# Patient Record
Sex: Male | Born: 1965
Health system: Southern US, Community
[De-identification: ages and names within clinical notes are randomized; demographics above are authoritative.]

## PROBLEM LIST (undated history)

## (undated) DIAGNOSIS — E119 Type 2 diabetes mellitus without complications: Secondary | ICD-10-CM

## (undated) DIAGNOSIS — I82409 Acute embolism and thrombosis of unspecified deep veins of unspecified lower extremity: Secondary | ICD-10-CM

---

## 2019-08-29 DIAGNOSIS — E109 Type 1 diabetes mellitus without complications: Secondary | ICD-10-CM | POA: Diagnosis not present

## 2019-08-29 DIAGNOSIS — H524 Presbyopia: Secondary | ICD-10-CM | POA: Diagnosis not present

## 2019-10-09 DIAGNOSIS — E291 Testicular hypofunction: Secondary | ICD-10-CM | POA: Diagnosis not present

## 2019-10-09 DIAGNOSIS — E039 Hypothyroidism, unspecified: Secondary | ICD-10-CM | POA: Diagnosis not present

## 2019-10-09 DIAGNOSIS — E1165 Type 2 diabetes mellitus with hyperglycemia: Secondary | ICD-10-CM | POA: Diagnosis not present

## 2020-01-15 DIAGNOSIS — I82411 Acute embolism and thrombosis of right femoral vein: Secondary | ICD-10-CM | POA: Diagnosis not present

## 2020-01-15 DIAGNOSIS — Z8739 Personal history of other diseases of the musculoskeletal system and connective tissue: Secondary | ICD-10-CM | POA: Diagnosis not present

## 2020-01-15 DIAGNOSIS — R0602 Shortness of breath: Secondary | ICD-10-CM | POA: Diagnosis not present

## 2020-01-15 DIAGNOSIS — E119 Type 2 diabetes mellitus without complications: Secondary | ICD-10-CM | POA: Diagnosis not present

## 2020-01-15 DIAGNOSIS — Z9989 Dependence on other enabling machines and devices: Secondary | ICD-10-CM | POA: Diagnosis not present

## 2020-01-15 DIAGNOSIS — Z86711 Personal history of pulmonary embolism: Secondary | ICD-10-CM | POA: Diagnosis not present

## 2020-01-15 DIAGNOSIS — I824Z1 Acute embolism and thrombosis of unspecified deep veins of right distal lower extremity: Secondary | ICD-10-CM | POA: Diagnosis not present

## 2020-01-15 DIAGNOSIS — I82401 Acute embolism and thrombosis of unspecified deep veins of right lower extremity: Secondary | ICD-10-CM | POA: Diagnosis not present

## 2020-01-15 DIAGNOSIS — I2699 Other pulmonary embolism without acute cor pulmonale: Secondary | ICD-10-CM | POA: Diagnosis not present

## 2020-01-15 DIAGNOSIS — I82451 Acute embolism and thrombosis of right peroneal vein: Secondary | ICD-10-CM | POA: Diagnosis not present

## 2020-01-15 DIAGNOSIS — Z86718 Personal history of other venous thrombosis and embolism: Secondary | ICD-10-CM | POA: Diagnosis not present

## 2020-01-15 DIAGNOSIS — G4733 Obstructive sleep apnea (adult) (pediatric): Secondary | ICD-10-CM | POA: Diagnosis not present

## 2020-01-15 DIAGNOSIS — Z794 Long term (current) use of insulin: Secondary | ICD-10-CM | POA: Diagnosis not present

## 2020-01-15 DIAGNOSIS — Z79899 Other long term (current) drug therapy: Secondary | ICD-10-CM | POA: Diagnosis not present

## 2020-01-16 DIAGNOSIS — I2699 Other pulmonary embolism without acute cor pulmonale: Secondary | ICD-10-CM | POA: Diagnosis not present

## 2020-01-16 DIAGNOSIS — I82411 Acute embolism and thrombosis of right femoral vein: Secondary | ICD-10-CM | POA: Diagnosis not present

## 2020-01-16 DIAGNOSIS — I824Z1 Acute embolism and thrombosis of unspecified deep veins of right distal lower extremity: Secondary | ICD-10-CM | POA: Diagnosis not present

## 2020-01-16 DIAGNOSIS — I82441 Acute embolism and thrombosis of right tibial vein: Secondary | ICD-10-CM | POA: Diagnosis not present

## 2020-01-17 DIAGNOSIS — I82441 Acute embolism and thrombosis of right tibial vein: Secondary | ICD-10-CM | POA: Diagnosis not present

## 2020-01-17 DIAGNOSIS — R079 Chest pain, unspecified: Secondary | ICD-10-CM | POA: Diagnosis not present

## 2020-01-17 DIAGNOSIS — I2699 Other pulmonary embolism without acute cor pulmonale: Secondary | ICD-10-CM | POA: Diagnosis not present

## 2020-01-17 DIAGNOSIS — I82431 Acute embolism and thrombosis of right popliteal vein: Secondary | ICD-10-CM | POA: Diagnosis not present

## 2020-01-17 DIAGNOSIS — I82411 Acute embolism and thrombosis of right femoral vein: Secondary | ICD-10-CM | POA: Diagnosis not present

## 2020-01-17 DIAGNOSIS — R9431 Abnormal electrocardiogram [ECG] [EKG]: Secondary | ICD-10-CM | POA: Diagnosis not present

## 2020-01-18 DIAGNOSIS — G4733 Obstructive sleep apnea (adult) (pediatric): Secondary | ICD-10-CM | POA: Diagnosis not present

## 2020-01-18 DIAGNOSIS — E119 Type 2 diabetes mellitus without complications: Secondary | ICD-10-CM | POA: Diagnosis not present

## 2020-01-18 DIAGNOSIS — I82401 Acute embolism and thrombosis of unspecified deep veins of right lower extremity: Secondary | ICD-10-CM | POA: Diagnosis not present

## 2020-01-18 DIAGNOSIS — I2699 Other pulmonary embolism without acute cor pulmonale: Secondary | ICD-10-CM | POA: Diagnosis not present

## 2020-01-20 DIAGNOSIS — R9431 Abnormal electrocardiogram [ECG] [EKG]: Secondary | ICD-10-CM | POA: Diagnosis not present

## 2020-01-22 DIAGNOSIS — I2699 Other pulmonary embolism without acute cor pulmonale: Secondary | ICD-10-CM | POA: Diagnosis not present

## 2020-01-22 DIAGNOSIS — R0602 Shortness of breath: Secondary | ICD-10-CM | POA: Diagnosis not present

## 2020-01-29 DIAGNOSIS — N289 Disorder of kidney and ureter, unspecified: Secondary | ICD-10-CM | POA: Diagnosis not present

## 2020-01-29 DIAGNOSIS — I2699 Other pulmonary embolism without acute cor pulmonale: Secondary | ICD-10-CM | POA: Diagnosis not present

## 2020-01-29 DIAGNOSIS — E559 Vitamin D deficiency, unspecified: Secondary | ICD-10-CM | POA: Diagnosis not present

## 2020-01-29 DIAGNOSIS — E039 Hypothyroidism, unspecified: Secondary | ICD-10-CM | POA: Diagnosis not present

## 2020-01-29 DIAGNOSIS — I82402 Acute embolism and thrombosis of unspecified deep veins of left lower extremity: Secondary | ICD-10-CM | POA: Diagnosis not present

## 2020-01-29 DIAGNOSIS — R790 Abnormal level of blood mineral: Secondary | ICD-10-CM | POA: Diagnosis not present

## 2020-01-29 DIAGNOSIS — E538 Deficiency of other specified B group vitamins: Secondary | ICD-10-CM | POA: Diagnosis not present

## 2020-01-29 DIAGNOSIS — E1165 Type 2 diabetes mellitus with hyperglycemia: Secondary | ICD-10-CM | POA: Diagnosis not present

## 2020-01-30 DIAGNOSIS — G4733 Obstructive sleep apnea (adult) (pediatric): Secondary | ICD-10-CM | POA: Diagnosis not present

## 2020-01-30 DIAGNOSIS — G4730 Sleep apnea, unspecified: Secondary | ICD-10-CM | POA: Diagnosis not present

## 2020-02-18 ENCOUNTER — Emergency Department (INDEPENDENT_AMBULATORY_CARE_PROVIDER_SITE_OTHER): Payer: BC Managed Care – PPO

## 2020-02-18 ENCOUNTER — Other Ambulatory Visit: Payer: Self-pay

## 2020-02-18 ENCOUNTER — Emergency Department
Admission: EM | Admit: 2020-02-18 | Discharge: 2020-02-18 | Disposition: A | Discharge status: Home / Self Care | Payer: Self-pay | Source: Home / Self Care

## 2020-02-18 DIAGNOSIS — L03114 Cellulitis of left upper limb: Secondary | ICD-10-CM | POA: Diagnosis not present

## 2020-02-18 DIAGNOSIS — M25422 Effusion, left elbow: Secondary | ICD-10-CM | POA: Diagnosis not present

## 2020-02-18 DIAGNOSIS — M7989 Other specified soft tissue disorders: Secondary | ICD-10-CM | POA: Diagnosis not present

## 2020-02-18 DIAGNOSIS — M71122 Other infective bursitis, left elbow: Secondary | ICD-10-CM

## 2020-02-18 DIAGNOSIS — M25522 Pain in left elbow: Secondary | ICD-10-CM

## 2020-02-18 HISTORY — DX: Type 2 diabetes mellitus without complications: E11.9

## 2020-02-18 HISTORY — DX: Acute embolism and thrombosis of unspecified deep veins of unspecified lower extremity: I82.409

## 2020-02-18 LAB — POCT CBC W AUTO DIFF (K'VILLE URGENT CARE)

## 2020-02-18 MED ORDER — CEFTRIAXONE SODIUM 1 G IJ SOLR
1.0000 g | Freq: Once | INTRAMUSCULAR | Status: AC
  Administered 2020-02-18: 1 g via INTRAMUSCULAR

## 2020-02-18 MED ORDER — PREDNISONE 50 MG PO TABS
ORAL_TABLET | ORAL | 0 refills | Status: DC
Start: 2020-02-18 — End: 2020-03-05

## 2020-02-18 MED ORDER — DOXYCYCLINE HYCLATE 100 MG PO TABS
100.0000 mg | ORAL_TABLET | Freq: Two times a day (BID) | ORAL | 0 refills | Status: AC
Start: 2020-02-18 — End: 2020-02-25

## 2020-02-18 MED ORDER — HYDROCODONE-ACETAMINOPHEN 10-325 MG PO TABS: 1.0000 | ORAL_TABLET | Freq: Three times a day (TID) | ORAL | 0 refills | Status: DC | PRN

## 2020-02-18 NOTE — Assessment & Plan Note (Addendum)
This is a pleasant 54 year old male, he has history of diabetes, over the past day he has developed severe and rapid onset swelling in his left elbow, over the olecranon bursitis, he had a fever of over 100, pain is severe. CBC showed a white count of 13,000. On exam he has a visibly swollen, erythematous, and hot elbow, particularly over the olecranon bursa, nonfluctuant, no skin breakdown. I think he has a septic olecranon bursitis, we will have the urgent care provider give him 1 g of Rocephin intramuscular, I am also going to have them add on a uric acid level to the blood already obtained. Adding x-rays. I am going to add high-dose hydrocodone, prednisone, doxycycline, he can return to see me sometime Wednesday or Thursday in my office.

## 2020-02-18 NOTE — ED Triage Notes (Signed)
Patient presents to Urgent Care with complaints of left elbow pain since last night. Patient reports his wife is a Engineer, civil (consulting) and she thinks it is gout. Pt had a blood clot 6 weeks ago and was hospitalized; is on Xarelto right now and has been taking it as directed. Elbow is red and swollen, is painful to even light palpation, no hx of gout.

## 2020-02-18 NOTE — Discharge Instructions (Signed)
  Please take the medications as prescribed by Dr. Benjamin Stain (Dr. Karie Schwalbe)  Call tomorrow to schedule a follow up appointment on Wednesday or Thursday this week.  If symptoms significantly worsen before then, please go to the hospital for further evaluation and treatment.

## 2020-02-18 NOTE — ED Provider Notes (Signed)
[facility]    CSN: [id number] Arrival date & time: 02/18/20  1345      History   Chief Complaint Chief Complaint  Patient presents with  . Gout  . Elbow Pain    HPI Thomas Carranza. is a 54 y.o. male.   HPI  Thomas Couser. is a 54 y.o. male presenting to UC with c/o sudden onset Left elbow pain, swelling, redness, limited ROM since last night. He was working in his basement last night but denies injury or heavy lifting. His wife, who is a Engineer, civil (consulting), believes he has gout. No hx of gout.  Pt did have a temp of 100.5*F this morning.  Mild nausea.  Hx of blood clot in his Right leg 6 weeks ago, was hospitalized. He is currently on Xarelto and has been taking as prescribed.     Past Medical History:  Diagnosis Date  . Diabetes mellitus without complication (HCC)   . DVT (deep venous thrombosis) [facility])     Patient Active Problem List   Diagnosis Date Noted  . Elbow swelling, left 02/18/2020    History reviewed. No pertinent surgical history.     Home Medications    Prior to Admission medications   Medication Sig Start Date End Date Taking? Authorizing Provider  insulin glargine (LANTUS) 100 UNIT/ML injection Inject into the skin daily.   Yes [provider]  metFORMIN (GLUCOPHAGE) 500 MG tablet Take by mouth 2 (two) times daily with a meal.   Yes [provider]  rivaroxaban (XARELTO) 20 MG TABS tablet Take 20 mg by mouth daily with supper.   Yes [provider]  doxycycline (VIBRA-TABS) 100 MG tablet Take 1 tablet (100 mg total) by mouth 2 (two) times daily for 7 days. 02/18/20 02/25/20  Monica Becton, MD  HYDROcodone-acetaminophen (NORCO) 10-325 MG tablet Take 1 tablet by mouth every 8 (eight) hours as needed. 02/18/20   Monica Becton, MD  predniSONE (DELTASONE) 50 MG tablet One tab PO daily for 5 days. 02/18/20   Monica Becton, MD    Family History Family History  Problem Relation Age of Onset  .  Healthy Mother   . Cancer Father     Social History Social History   Tobacco Use  . Smoking status: Never Smoker  . Smokeless tobacco: Never Used  Vaping Use  . Vaping Use: Never used  Substance Use Topics  . Alcohol use: Not Currently  . Drug use: Not on file     Allergies   Patient has no known allergies.   Review of Systems Review of Systems  Constitutional: Positive for fever. Negative for chills.  Gastrointestinal: Positive for nausea. Negative for vomiting.  Musculoskeletal: Positive for arthralgias, joint swelling and myalgias.  Skin: Positive for color change. Negative for wound.     Physical Exam Triage Vital Signs ED Triage Vitals  Enc Vitals Group     BP 02/18/20 1425 118/76     Pulse Rate 02/18/20 1425 88     Resp 02/18/20 1425 16     Temp 02/18/20 1425 100 F (37.8 C)     Temp Source 02/18/20 1425 Oral     SpO2 02/18/20 1425 97 %     Weight --      Height --      Head Circumference --      Peak Flow --      Pain Score 02/18/20 1421 8     Pain Loc --  Pain Edu? --      Excl. in GC? --    No data found.  Updated Vital Signs BP 118/76 (BP Location: Right Arm)   Pulse 88   Temp 100 F (37.8 C) (Oral)   Resp 16   SpO2 97%   Visual Acuity Right Eye Distance:   Left Eye Distance:   Bilateral Distance:    Right Eye Near:   Left Eye Near:    Bilateral Near:     Physical Exam Vitals and nursing note reviewed.  Constitutional:      General: He is not in acute distress.    Appearance: Normal appearance. He is well-developed. He is not ill-appearing.  HENT:     Head: Normocephalic and atraumatic.  Cardiovascular:     Rate and Rhythm: Normal rate and regular rhythm.     Pulses:          Radial pulses are 2+ on the left side.  Pulmonary:     Effort: Pulmonary effort is normal.  Musculoskeletal:        General: Swelling and tenderness present.     Cervical back: Normal range of motion.     Comments: Left elbow: mild to moderate  edema, medial aspect. Tenderness with light touch. Decreased flexion due to pain. No tenderness over olecranon.   No tenderness to shoulder or wrist. 5/5 grip strength.   Skin:    General: Skin is warm and dry.     Capillary Refill: Capillary refill takes less than 2 seconds.     Findings: Erythema present.  Neurological:     Mental Status: He is alert and oriented to person, place, and time.     Sensory: No sensory deficit.  Psychiatric:        Behavior: Behavior normal.      UC Treatments / Results  Labs (all labs ordered are listed, but only abnormal results are displayed) Labs Reviewed  URIC ACID  POCT CBC W AUTO DIFF ([facility])    EKG   Radiology No results found.  Procedures Procedures (including critical care time)  Medications Ordered in UC Medications  cefTRIAXone (ROCEPHIN) injection 1 g (1 g Intramuscular Given 02/18/20 1612)    Initial Impression / Assessment and Plan / UC Course  I have reviewed the triage vital signs and the nursing notes.  Pertinent labs & imaging results that were available during my care of the patient were reviewed by me and considered in my medical decision making (see chart for details).     CBC: WBC- 13.3 Consulted with Dr. Benjamin Stain, Sports Medicine, who also examined pt. Will tx for cellulitis  Uric acid test- pending, gout less likely given fever  Pt to f/u with Dr. Benjamin Stain in 2-3 days AVS provided  Final Clinical Impressions(s) / UC Diagnoses   Final diagnoses:  Pain and swelling of left elbow  Cellulitis of left elbow     Discharge Instructions      Please take the medications as prescribed by Dr. Benjamin Stain (Dr. Karie Schwalbe)  Call tomorrow to schedule a follow up appointment on Wednesday or Thursday this week.  If symptoms significantly worsen before then, please go to the hospital for further evaluation and treatment.     ED Prescriptions    Medication Sig Dispense Auth. Provider    predniSONE (DELTASONE) 50 MG tablet One tab PO daily for 5 days. 5 tablet Monica Becton, MD   HYDROcodone-acetaminophen [facility]) 10-325 MG tablet Take 1 tablet by mouth every  8 (eight) hours as needed. 15 tablet Monica Becton, MD   doxycycline (VIBRA-TABS) 100 MG tablet Take 1 tablet (100 mg total) by mouth 2 (two) times daily for 7 days. 14 tablet Monica Becton, MD     PDMP not reviewed this encounter.   Lurene Shadow, [place] 02/18/20 2033

## 2020-02-18 NOTE — Consult Note (Signed)
    Procedures performed today:    None.  Independent interpretation of notes and tests performed by another provider:   None.  Brief History, Exam, Impression, and Recommendations:    Elbow swelling, left This is a pleasant 54 year old male, he has history of diabetes, over the past day he has developed severe and rapid onset swelling in his left elbow, over the olecranon bursitis, he had a fever of over 100, pain is severe. CBC showed a white count of 13,000. On exam he has a visibly swollen, erythematous, and hot elbow, particularly over the olecranon bursa, nonfluctuant, no skin breakdown. I think he has a septic olecranon bursitis, we will have the urgent care provider give him 1 g of Rocephin intramuscular, I am also going to have them add on a uric acid level to the blood already obtained. Adding x-rays. I am going to add high-dose hydrocodone, prednisone, doxycycline, he can return to see me sometime Wednesday or Thursday in my office.    ___________________________________________ Ihor Austin. Benjamin Stain, M.D., ABFM., CAQSM. Primary Care and Sports Medicine [facility]  Adjunct Instructor of Family Medicine  [employer]

## 2020-02-19 LAB — URIC ACID: Uric Acid, Serum: 2.2 mg/dL — ABNORMAL LOW (ref 4.0–8.0)

## 2020-02-20 ENCOUNTER — Ambulatory Visit (INDEPENDENT_AMBULATORY_CARE_PROVIDER_SITE_OTHER): Payer: BC Managed Care – PPO | Admitting: Sports Medicine

## 2020-02-20 ENCOUNTER — Encounter: Payer: Self-pay | Admitting: Sports Medicine

## 2020-02-20 DIAGNOSIS — L0390 Cellulitis, unspecified: Secondary | ICD-10-CM | POA: Diagnosis not present

## 2020-02-20 DIAGNOSIS — M71122 Other infective bursitis, left elbow: Secondary | ICD-10-CM | POA: Diagnosis not present

## 2020-02-20 NOTE — Progress Notes (Signed)
° ° °  Procedures performed today:    Procedure:  Injection of septic left olecranon bursa Consent obtained and verified. Time-out conducted. Noted no overlying erythema, induration, or other signs of local infection. Skin prepped in a sterile fashion. Topical analgesic spray: Ethyl chloride. I advanced an 18-gauge needle into the olecranon bursa and aspirated approximately 3 mL of frank pus. Advised to call if fevers/chills, erythema, induration, drainage, or persistent bleeding.  Independent interpretation of notes and tests performed by another provider:   X-rays were personally reviewed and negative with the exception of soft tissue subcutaneous stranding  Brief History, Exam, Impression, and Recommendations:    Septic olecranon bursitis, left This is a very pleasant 54 year old male, I saw him on Monday, he had severe elbow swelling, pain, redness, leukocytosis, fevers. We put him on steroids and doxycycline, he has improved approximately 50%. He still has significant fluctuance of his olecranon bursa, today I performed an olecranon bursa aspiration with approximately 3 cc of frank pus, this will be sent off for cultures, cell counts, crystal analysis. He will continue doxycycline for the full 7 days, he finishes prednisone on Friday. Return to see me tentatively in 2 weeks. If he has a recurrence or insufficient improvement we will consider double incision, drainage, and irrigation.    ___________________________________________ Ihor Austin. Benjamin Stain, M.D., ABFM., CAQSM. Primary Care and Sports Medicine [facility] [facility]  Adjunct Instructor of Family Medicine  [employer]

## 2020-02-20 NOTE — Assessment & Plan Note (Addendum)
This is a very pleasant 54 year old male, I saw him on Monday, he had severe elbow swelling, pain, redness, leukocytosis, fevers. We put him on steroids and doxycycline, he has improved approximately 50%. He still has significant fluctuance of his olecranon bursa, today I performed an olecranon bursa aspiration with approximately 3 cc of frank pus, this will be sent off for cultures, cell counts, crystal analysis. He will continue doxycycline for the full 7 days, he finishes prednisone on Friday. Return to see me tentatively in 2 weeks. If he has a recurrence or insufficient improvement we will consider double incision, drainage, and irrigation.

## 2020-02-21 LAB — CELL COUNT AND DIFF, FLUID, OTHER
Basophils, %: 0 %
Eosinophils, %: 0 %
Lymphocytes, %: 8 %
Mesothelial, %: 0 %
Monocyte/Macrophage %: 6 %
Neutrophils, %: 86 %
Total Nucleated Cell Ct: 121600 cells/uL

## 2020-02-21 LAB — SYNOVIAL FLUID, CRYSTAL

## 2020-02-25 ENCOUNTER — Telehealth: Payer: Self-pay | Admitting: *Deleted

## 2020-02-25 NOTE — Telephone Encounter (Signed)
Pt left vm this morning letting you know that yesterday his elvow started swelling back up and is really painful again.

## 2020-02-25 NOTE — Telephone Encounter (Signed)
Pt notified and transferred to scheduling.

## 2020-02-25 NOTE — Telephone Encounter (Signed)
Lets have him come back, maybe wed and I will do an incision and irrigation.

## 2020-02-26 LAB — ANAEROBIC AND AEROBIC CULTURE
MICRO NUMBER:: 10760811
MICRO NUMBER:: 10760812
SPECIMEN QUALITY:: ADEQUATE
SPECIMEN QUALITY:: ADEQUATE

## 2020-02-27 ENCOUNTER — Other Ambulatory Visit: Payer: Self-pay

## 2020-02-27 ENCOUNTER — Ambulatory Visit: Payer: BC Managed Care – PPO | Admitting: Sports Medicine

## 2020-02-27 DIAGNOSIS — M71122 Other infective bursitis, left elbow: Secondary | ICD-10-CM | POA: Diagnosis not present

## 2020-02-27 MED ORDER — ACETAMINOPHEN ER 650 MG PO TBCR: 650.0000 mg | EXTENDED_RELEASE_TABLET | Freq: Three times a day (TID) | ORAL | 3 refills | Status: AC | PRN

## 2020-02-27 MED ORDER — CLINDAMYCIN HCL 300 MG PO CAPS: 300.0000 mg | ORAL_CAPSULE | Freq: Three times a day (TID) | ORAL | 0 refills | Status: DC

## 2020-02-27 NOTE — Progress Notes (Signed)
    Procedures performed today:    Complicated Incision and drainage of abscess. Risks, benefits, and alternatives explained and consent obtained. Time out conducted. Surface cleaned with alcohol. 5cc lidocaine with epinephine infiltrated around abscess/olecranon bursa. Adequate anesthesia ensured. Area prepped and draped in a sterile fashion. #11 blade used to make a stab incision into abscess. Pus expressed with pressure. Curved hemostat used to explore 4 quadrants and loculations broken up. Further purulence expressed. Iodoform packing placed leaving a 1-inch tail. Hemostasis achieved. Pt stable. Aftercare and follow-up advised.  Independent interpretation of notes and tests performed by another provider:   None.  Brief History, Exam, Impression, and Recommendations:    Septic olecranon bursitis, left Thomas Crosby, he is a pleasant 54 year old male, we initially saw him about a week ago with severe elbows pain and swelling, we put him on steroids and doxycycline and he had a 50% provement, I aspirated frank pus, sent off for cultures which grew out fairly resistant MRSA, sensitive to doxycycline and clindamycin. He has improved to some degree with regards to the erythema and warmth but still has significant swelling and pressure, today I performed incision and drainage of his olecranon bursa with packing. I am going to go ahead and switch him to clindamycin which has a better MIC. Oxycodone for pain at night, Tylenol for pain during the day, family will remove 1 inch of packing per day. Return to see me in a week.    ___________________________________________ Thomas Crosby. Benjamin Stain, M.D., ABFM., CAQSM. Primary Care and Sports Medicine [facility] [facility]  Adjunct Instructor of Family Medicine  [employer]

## 2020-02-27 NOTE — Patient Instructions (Signed)
Incision and Drainage, Care After This sheet gives you information about how to care for yourself after your procedure. Your health care provider may also give you more specific instructions. If you have problems or questions, contact your health care provider. What can I expect after the procedure? After the procedure, it is common to have:  Pain or discomfort around the incision site.  Blood, fluid, or pus (drainage) from the incision.  Redness and firm skin around the incision site. Follow these instructions at home: Medicines  Take over-the-counter and prescription medicines only as told by your health care provider.  If you were prescribed an antibiotic medicine, use or take it as told by your health care provider. Do not stop using the antibiotic even if you start to feel better. Wound care Follow instructions from your health care provider about how to take care of your wound. Make sure you:  Wash your hands with soap and water before and after you change your bandage (dressing). If soap and water are not available, use hand sanitizer.  Change your dressing and packing as told by your health care provider. ? If your dressing is dry or stuck when you try to remove it, moisten or wet the dressing with saline or water so that it can be removed without harming your skin or tissues. ? If your wound is packed, leave it in place until your health care provider tells you to remove it. To remove the packing, moisten or wet the packing with saline or water so that it can be removed without harming your skin or tissues.  Leave stitches (sutures), skin glue, or adhesive strips in place. These skin closures may need to stay in place for 2 weeks or longer. If adhesive strip edges start to loosen and curl up, you may trim the loose edges. Do not remove adhesive strips completely unless your health care provider tells you to do that. Check your wound every day for signs of infection. Check  for:  More redness, swelling, or pain.  More fluid or blood.  Warmth.  Pus or a bad smell. If you were sent home with a drain tube in place, follow instructions from your health care provider about:  How to empty it.  How to care for it at home.  General instructions  Rest the affected area.  Do not take baths, swim, or use a hot tub until your health care provider approves. Ask your health care provider if you may take showers. You may only be allowed to take sponge baths.  Return to your normal activities as told by your health care provider. Ask your health care provider what activities are safe for you. Your health care provider may put you on activity or lifting restrictions.  The incision will continue to drain. It is normal to have some clear or slightly bloody drainage. The amount of drainage should lessen each day.  Do not apply any creams, ointments, or liquids unless you have been told to by your health care provider.  Keep all follow-up visits as told by your health care provider. This is important. Contact a health care provider if:  Your cyst or abscess returns.  You have a fever or chills.  You have more redness, swelling, or pain around your incision.  You have more fluid or blood coming from your incision.  Your incision feels warm to the touch.  You have pus or a bad smell coming from your incision.  You have red streaks   above or below the incision site. Get help right away if:  You have severe pain or bleeding.  You cannot eat or drink without vomiting.  You have decreased urine output.  You become short of breath.  You have chest pain.  You cough up blood.  The affected area becomes numb or starts to tingle. These symptoms may represent a serious problem that is an emergency. Do not wait to see if the symptoms will go away. Get medical help right away. Call your local emergency services (911 in the [place] Do not drive yourself to the  hospital. Summary  After this procedure, it is common to have fluid, blood, or pus coming from the surgery site.  Follow all home care instructions. You will be told how to take care of your incision, how to check for infection, and how to take medicines.  If you were prescribed an antibiotic medicine, take it as told by your health care provider. Do not stop taking the antibiotic even if you start to feel better.  Contact a health care provider if you have increased redness, swelling, or pain around your incision. Get help right away if you have chest pain, you vomit, you cough up blood, or you have shortness of breath.  Keep all follow-up visits as told by your health care provider. This is important. This information is not intended to replace advice given to you by your health care provider. Make sure you discuss any questions you have with your health care provider. Document Revised: 06/12/2018 Document Reviewed: 06/12/2018 Elsevier Patient Education  2020 Elsevier Inc.   

## 2020-02-27 NOTE — Assessment & Plan Note (Signed)
Thomas Crosby returns, he is a pleasant 54 year old male, we initially saw him about a week ago with severe elbows pain and swelling, we put him on steroids and doxycycline and he had a 50% provement, I aspirated frank pus, sent off for cultures which grew out fairly resistant MRSA, sensitive to doxycycline and clindamycin. He has improved to some degree with regards to the erythema and warmth but still has significant swelling and pressure, today I performed incision and drainage of his olecranon bursa with packing. I am going to go ahead and switch him to clindamycin which has a better MIC. Oxycodone for pain at night, Tylenol for pain during the day, family will remove 1 inch of packing per day. Return to see me in a week.

## 2020-03-05 ENCOUNTER — Ambulatory Visit (INDEPENDENT_AMBULATORY_CARE_PROVIDER_SITE_OTHER): Payer: BC Managed Care – PPO | Admitting: Sports Medicine

## 2020-03-05 DIAGNOSIS — M71122 Other infective bursitis, left elbow: Secondary | ICD-10-CM

## 2020-03-05 NOTE — Progress Notes (Signed)
    Procedures performed today:    None.  Independent interpretation of notes and tests performed by another provider:   None.  Brief History, Exam, Impression, and Recommendations:    Septic olecranon bursitis, left Thomas Crosby returns, he is a pleasant 54 year old male, he had severe elbow pain, ultimately confirmed to be MRSA septic olecranon bursitis, he has been treated with doxycycline, clindamycin, incision and drainage with packing, he returns today doing significantly better, only minimal serous drainage, he has 1 more day of clindamycin. Continue to cover this as long as it is draining, return to see me on an as-needed basis.    ___________________________________________ Ihor Austin. Benjamin Stain, M.D., ABFM., CAQSM. Primary Care and Sports Medicine [place] [facility]  Adjunct Instructor of Family Medicine  [employer]

## 2020-03-05 NOTE — Assessment & Plan Note (Signed)
Thomas Crosby returns, he is a pleasant 54 year old male, he had severe elbow pain, ultimately confirmed to be MRSA septic olecranon bursitis, he has been treated with doxycycline, clindamycin, incision and drainage with packing, he returns today doing significantly better, only minimal serous drainage, he has 1 more day of clindamycin. Continue to cover this as long as it is draining, return to see me on an as-needed basis.

## 2020-05-01 DIAGNOSIS — G4733 Obstructive sleep apnea (adult) (pediatric): Secondary | ICD-10-CM | POA: Diagnosis not present

## 2020-05-16 ENCOUNTER — Emergency Department
Admission: EM | Admit: 2020-05-16 | Discharge: 2020-05-16 | Disposition: A | Discharge status: Home / Self Care | Payer: BC Managed Care – PPO | Source: Home / Self Care

## 2020-05-16 ENCOUNTER — Other Ambulatory Visit: Payer: Self-pay

## 2020-05-16 DIAGNOSIS — L03115 Cellulitis of right lower limb: Secondary | ICD-10-CM | POA: Diagnosis not present

## 2020-05-16 MED ORDER — CLINDAMYCIN HCL 300 MG PO CAPS: 300.0000 mg | ORAL_CAPSULE | Freq: Three times a day (TID) | ORAL | 0 refills | Status: AC

## 2020-05-16 NOTE — Discharge Instructions (Signed)
°  Keep area clean with warm water and soap. Keep leg elevated. Take medication with food.  Call to schedule a follow up with Dr. Benjamin Stain on Monday or Tuesday, but if symptoms worsening this weekend you can return to urgent care for a recheck or go to the hospital if symptoms significantly worsening this weekend.

## 2020-05-16 NOTE — ED Triage Notes (Signed)
Patient presents to Urgent Care with complaints of abrasion to right knee since 6 days ago. Patient reports he rubbed it against the side of a boat, a few days later two smaller wounds opened up and now redness is creeping down his lower leg. Pt states he had a similar infection on his elbow recently and it was MRSA.

## 2020-05-16 NOTE — ED Provider Notes (Signed)
[facility]    CSN: [id number] Arrival date & time: 05/16/20  1656      History   Chief Complaint Chief Complaint  Patient presents with  . Abscess    R Knee    HPI Thomas Crosby. is a 54 y.o. male.   HPI  Thomas Crosby. is a 54 y.o. male presenting to UC with c/o gradually worsening redness, swelling and pain of Right knee after accidentally scrapping it on a boat about 6 days ago.  He has since noticed a small abrasion and two smaller wounds appear.  Denies fever, chills, n/v/d. He was tx in August of this year for septic olecranon bursitis of Right elbow secondary to MRSA.    Past Medical History:  Diagnosis Date  . Diabetes mellitus without complication (HCC)   . DVT (deep venous thrombosis) [facility])     Patient Active Problem List   Diagnosis Date Noted  . Septic olecranon bursitis, left 02/18/2020    History reviewed. No pertinent surgical history.     Home Medications    Prior to Admission medications   Medication Sig Start Date End Date Taking? Authorizing Provider  acetaminophen (TYLENOL) 650 MG CR tablet Take 1 tablet (650 mg total) by mouth every 8 (eight) hours as needed for pain. 02/27/20   Monica Becton, MD  clindamycin (CLEOCIN) 300 MG capsule Take 1 capsule (300 mg total) by mouth 3 (three) times daily for 7 days. 05/16/20 05/23/20  Lurene Shadow, PA-C  insulin glargine (LANTUS) 100 UNIT/ML injection Inject into the skin daily.    [provider]  metFORMIN (GLUCOPHAGE) 500 MG tablet Take by mouth 2 (two) times daily with a meal.    [provider]  rivaroxaban (XARELTO) 20 MG TABS tablet Take 20 mg by mouth daily with supper.    [provider]    Family History Family History  Problem Relation Age of Onset  . Healthy Mother   . Cancer Father     Social History Social History   Tobacco Use  . Smoking status: Never Smoker  . Smokeless tobacco: Never Used  Vaping Use  . Vaping Use:  Never used  Substance Use Topics  . Alcohol use: Not Currently  . Drug use: Not on file     Allergies   Patient has no known allergies.   Review of Systems Review of Systems  Constitutional: Negative for chills and fever.  Gastrointestinal: Negative for nausea and vomiting.  Musculoskeletal: Positive for arthralgias and joint swelling.  Skin: Positive for color change and wound.     Physical Exam Triage Vital Signs ED Triage Vitals  Enc Vitals Group     BP 05/16/20 1712 132/84     Pulse Rate 05/16/20 1712 88     Resp 05/16/20 1712 16     Temp 05/16/20 1712 99.2 F (37.3 C)     Temp Source 05/16/20 1712 Oral     SpO2 05/16/20 1712 99 %     Weight --      Height --      Head Circumference --      Peak Flow --      Pain Score 05/16/20 1711 7     Pain Loc --      Pain Edu? --      Excl. in GC? --    No data found.  Updated Vital Signs BP 132/84 (BP Location: Right Arm)   Pulse 88  Temp 99.2 F (37.3 C) (Oral)   Resp 16   SpO2 99%   Visual Acuity Right Eye Distance:   Left Eye Distance:   Bilateral Distance:    Right Eye Near:   Left Eye Near:    Bilateral Near:     Physical Exam Vitals and nursing note reviewed.  Constitutional:      Appearance: He is well-developed.  HENT:     Head: Normocephalic and atraumatic.  Cardiovascular:     Rate and Rhythm: Normal rate.  Pulmonary:     Effort: Pulmonary effort is normal.  Musculoskeletal:        General: Swelling and tenderness present. Normal range of motion.     Cervical back: Normal range of motion.     Comments: Right knee: mild to moderate edema, tenderness to anterior aspect, full ROM Calf is soft, non-tender  Skin:    General: Skin is warm and dry.     Findings: Erythema present.     Comments: Right knee: superficial abrasion over anterior aspect patella, 29mm scabs on superior aspect of knee. Surrounding erythema, warmth and tenderness.   Neurological:     Mental Status: He is alert and  oriented to person, place, and time.  Psychiatric:        Behavior: Behavior normal.      UC Treatments / Results  Labs (all labs ordered are listed, but only abnormal results are displayed) Labs Reviewed - No data to display  EKG   Radiology No results found.  Procedures Procedures (including critical care time)  Medications Ordered in UC Medications - No data to display  Initial Impression / Assessment and Plan / UC Course  I have reviewed the triage vital signs and the nursing notes.  Pertinent labs & imaging results that were available during my care of the patient were reviewed by me and considered in my medical decision making (see chart for details).     Hx and exam c/w cellulitis of Right knee Rx: clindamycin   Final Clinical Impressions(s) / UC Diagnoses   Final diagnoses:  Cellulitis of right knee     Discharge Instructions      Keep area clean with warm water and soap. Keep leg elevated. Take medication with food.  Call to schedule a follow up with Dr. Benjamin Stain on Monday or Tuesday, but if symptoms worsening this weekend you can return to urgent care for a recheck or go to the hospital if symptoms significantly worsening this weekend.     ED Prescriptions    Medication Sig Dispense Auth. Provider   clindamycin (CLEOCIN) 300 MG capsule Take 1 capsule (300 mg total) by mouth 3 (three) times daily for 7 days. 21 capsule Lurene Shadow, [place]     PDMP not reviewed this encounter.   Lurene Shadow, PA-C 05/19/20 0830

## 2020-05-22 ENCOUNTER — Ambulatory Visit (INDEPENDENT_AMBULATORY_CARE_PROVIDER_SITE_OTHER): Payer: BC Managed Care – PPO | Admitting: Sports Medicine

## 2020-05-22 ENCOUNTER — Other Ambulatory Visit: Payer: Self-pay

## 2020-05-22 DIAGNOSIS — L03115 Cellulitis of right lower limb: Secondary | ICD-10-CM | POA: Diagnosis not present

## 2020-05-22 MED ORDER — MUPIROCIN CALCIUM 2 % NA OINT: 1.0000 "application " | TOPICAL_OINTMENT | Freq: Two times a day (BID) | NASAL | 3 refills | Status: AC

## 2020-05-22 MED ORDER — CHLORHEXIDINE GLUCONATE 4 % EX LIQD: CUTANEOUS | 11 refills | Status: AC

## 2020-05-22 MED ORDER — SULFAMETHOXAZOLE-TRIMETHOPRIM 800-160 MG PO TABS: 1.0000 | ORAL_TABLET | Freq: Two times a day (BID) | ORAL | 0 refills | Status: DC

## 2020-05-22 NOTE — Progress Notes (Signed)
    Procedures performed today:    None.  Independent interpretation of notes and tests performed by another provider:   None.  Brief History, Exam, Impression, and Recommendations:    Cellulitis of knee, right Thomas Crosby is a pleasant 54 year old male, he skinned his knee, and been treated for 2 days, left open. Unfortunately developed into what appeared to be a fairly severe cellulitis, he was seen in urgent care and prescribed clindamycin, referred to me for further evaluation. Of note we did treat him for a MRSA septic olecranon bursitis earlier this year. He has noted some improvements however MRSA only has a 75% susceptibility to clindamycin so we will go ahead and switch to Septra for 10 days. He will do warm compresses and keep it covered. If he has an injury at work he will wash it immediately and cover with some Neosporin. Because this is the second occurrence of a likely MRSA skin and soft tissue infection we are going to do the MRSA systemic decolonization protocol for him and his wife.    ___________________________________________ Ihor Austin. Benjamin Stain, M.D., ABFM., CAQSM. Primary Care and Sports Medicine [facility]  Adjunct Instructor of Family Medicine  [employer]

## 2020-05-22 NOTE — Assessment & Plan Note (Signed)
Thomas Crosby is a pleasant 54 year old male, he skinned his knee, and been treated for 2 days, left open. Unfortunately developed into what appeared to be a fairly severe cellulitis, he was seen in urgent care and prescribed clindamycin, referred to me for further evaluation. Of note we did treat him for a MRSA septic olecranon bursitis earlier this year. He has noted some improvements however MRSA only has a 75% susceptibility to clindamycin so we will go ahead and switch to Septra for 10 days. He will do warm compresses and keep it covered. If he has an injury at work he will wash it immediately and cover with some Neosporin. Because this is the second occurrence of a likely MRSA skin and soft tissue infection we are going to do the MRSA systemic decolonization protocol for him and his wife.

## 2020-05-22 NOTE — Patient Instructions (Signed)
MRSA Decolonization Protocol  1.  Pick a start date for everyone in the household to begin together. 2.  Bactroban (mupirocin 2% ointment): apply to nose and inhale if nasal applicator or if using ointment work up to inside of both nostrils. Use twice daily for 5 days. 3.  Skin Washes: use chlorhexidene (Hibiclens) or hexachlorophene (Phisophex) every other day for 1-2 weeks and then decrease to twice weekly. Try to apply to all body areas (except eyes, mouth) and let sit for 5 minutes before showering off.

## 2020-05-29 ENCOUNTER — Ambulatory Visit: Payer: BC Managed Care – PPO | Admitting: Sports Medicine

## 2020-06-02 ENCOUNTER — Ambulatory Visit (INDEPENDENT_AMBULATORY_CARE_PROVIDER_SITE_OTHER): Payer: BC Managed Care – PPO | Admitting: Sports Medicine

## 2020-06-02 ENCOUNTER — Encounter: Payer: Self-pay | Admitting: Sports Medicine

## 2020-06-02 DIAGNOSIS — L03115 Cellulitis of right lower limb: Secondary | ICD-10-CM

## 2020-06-02 MED ORDER — SULFAMETHOXAZOLE-TRIMETHOPRIM 800-160 MG PO TABS: 1.0000 | ORAL_TABLET | Freq: Two times a day (BID) | ORAL | 0 refills | Status: AC

## 2020-06-02 NOTE — Progress Notes (Signed)
    Procedures performed today:    None.  Independent interpretation of notes and tests performed by another provider:   None.  Brief History, Exam, Impression, and Recommendations:    Cellulitis of knee, right Ester returns, he is a very pleasant 54 year old male, I saw him 10 days ago, he had an abrasion on his knee that appeared to turn into a cellulitis, he was treated initially with clindamycin, we switched him to Septra for 10 days. Of note he did have MRSA septic olecranon bursitis earlier this year. He returns today almost completely better, there is only a small nodule, minimally erythematous on the proximal anterolateral aspect of his knee. Symptoms are thus not completely at goal. Adding an additional 7 days of Septra, he will continue warm compresses and massage. And return to see me as needed. We did start the MRSA systemic decolonization protocol, he and his wife are going to really need to get more diligent with this.    ___________________________________________ Ihor Austin. Benjamin Stain, M.D., ABFM., CAQSM. Primary Care and Sports Medicine [facility]  Adjunct Instructor of Family Medicine  [employer]

## 2020-06-02 NOTE — Assessment & Plan Note (Addendum)
Thomas Crosby returns, he is a very pleasant 54 year old male, I saw him 10 days ago, he had an abrasion on his knee that appeared to turn into a cellulitis, he was treated initially with clindamycin, we switched him to Septra for 10 days. Of note he did have MRSA septic olecranon bursitis earlier this year. He returns today almost completely better, there is only a small nodule, minimally erythematous on the proximal anterolateral aspect of his knee. Symptoms are thus not completely at goal. Adding an additional 7 days of Septra, he will continue warm compresses and massage. And return to see me as needed. We did start the MRSA systemic decolonization protocol, he and his wife are going to really need to get more diligent with this.

## 2020-06-16 DIAGNOSIS — I82402 Acute embolism and thrombosis of unspecified deep veins of left lower extremity: Secondary | ICD-10-CM | POA: Diagnosis not present

## 2020-06-16 DIAGNOSIS — Z0001 Encounter for general adult medical examination with abnormal findings: Secondary | ICD-10-CM | POA: Diagnosis not present

## 2020-06-16 DIAGNOSIS — E1165 Type 2 diabetes mellitus with hyperglycemia: Secondary | ICD-10-CM | POA: Diagnosis not present

## 2020-06-16 DIAGNOSIS — E291 Testicular hypofunction: Secondary | ICD-10-CM | POA: Diagnosis not present

## 2021-04-11 IMAGING — DX DG ELBOW COMPLETE 3+V*L*
4 series · 4 of 4 positions shown · non-contrast
Comparison: None.

CLINICAL DATA: Acute left elbow pain and swelling.

EXAM:
LEFT ELBOW - COMPLETE 3+ VIEW

[elbow ap]
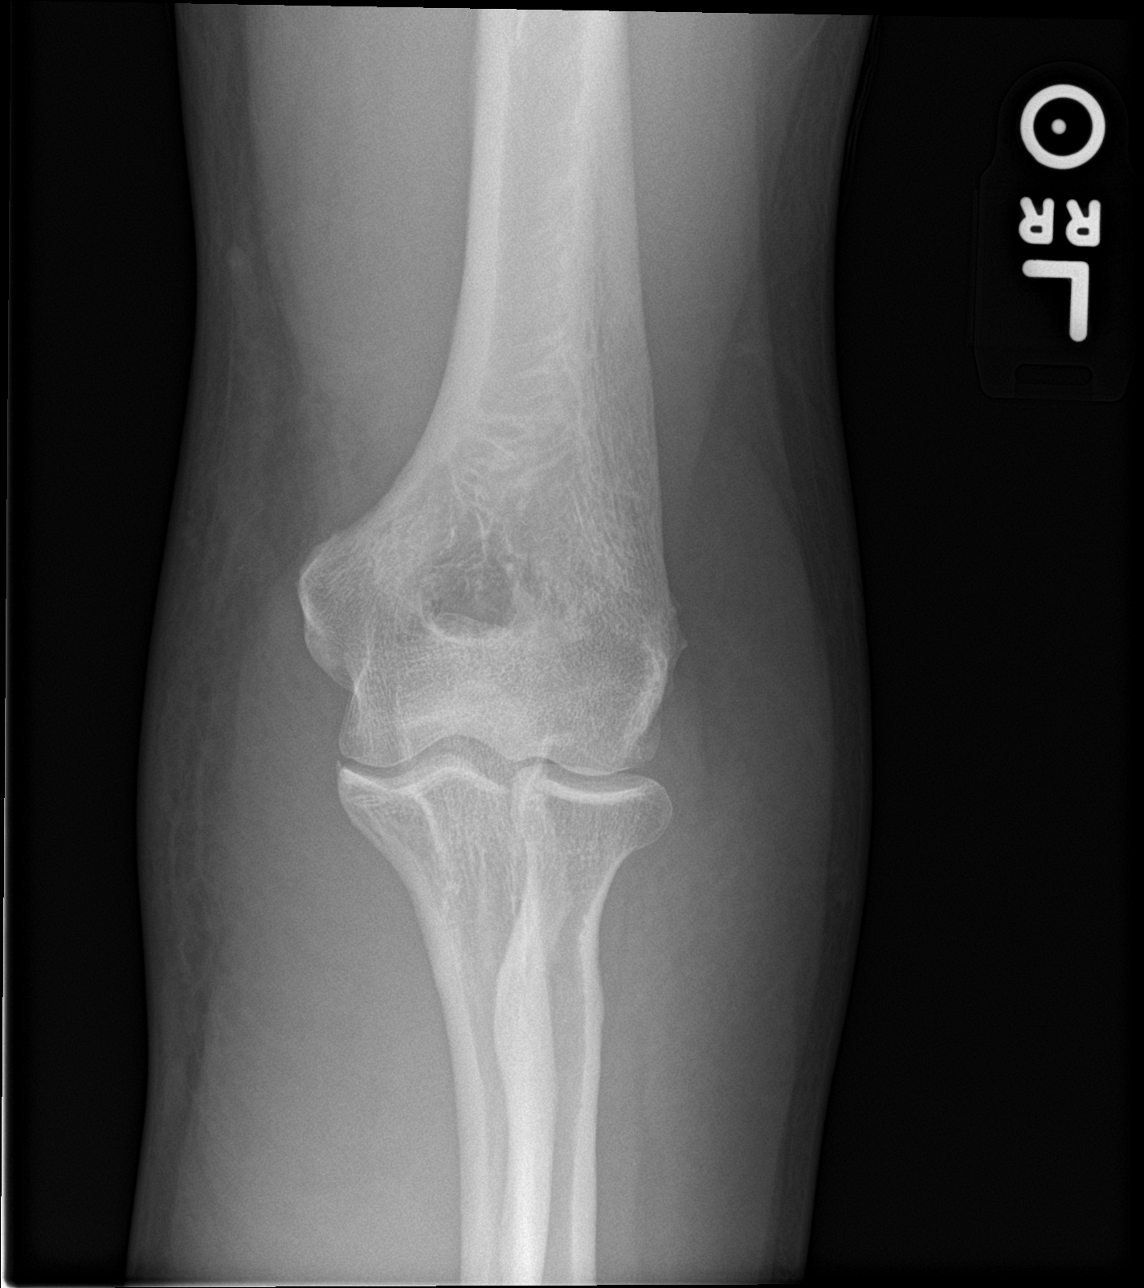

[elbow obl (1 of 2)]
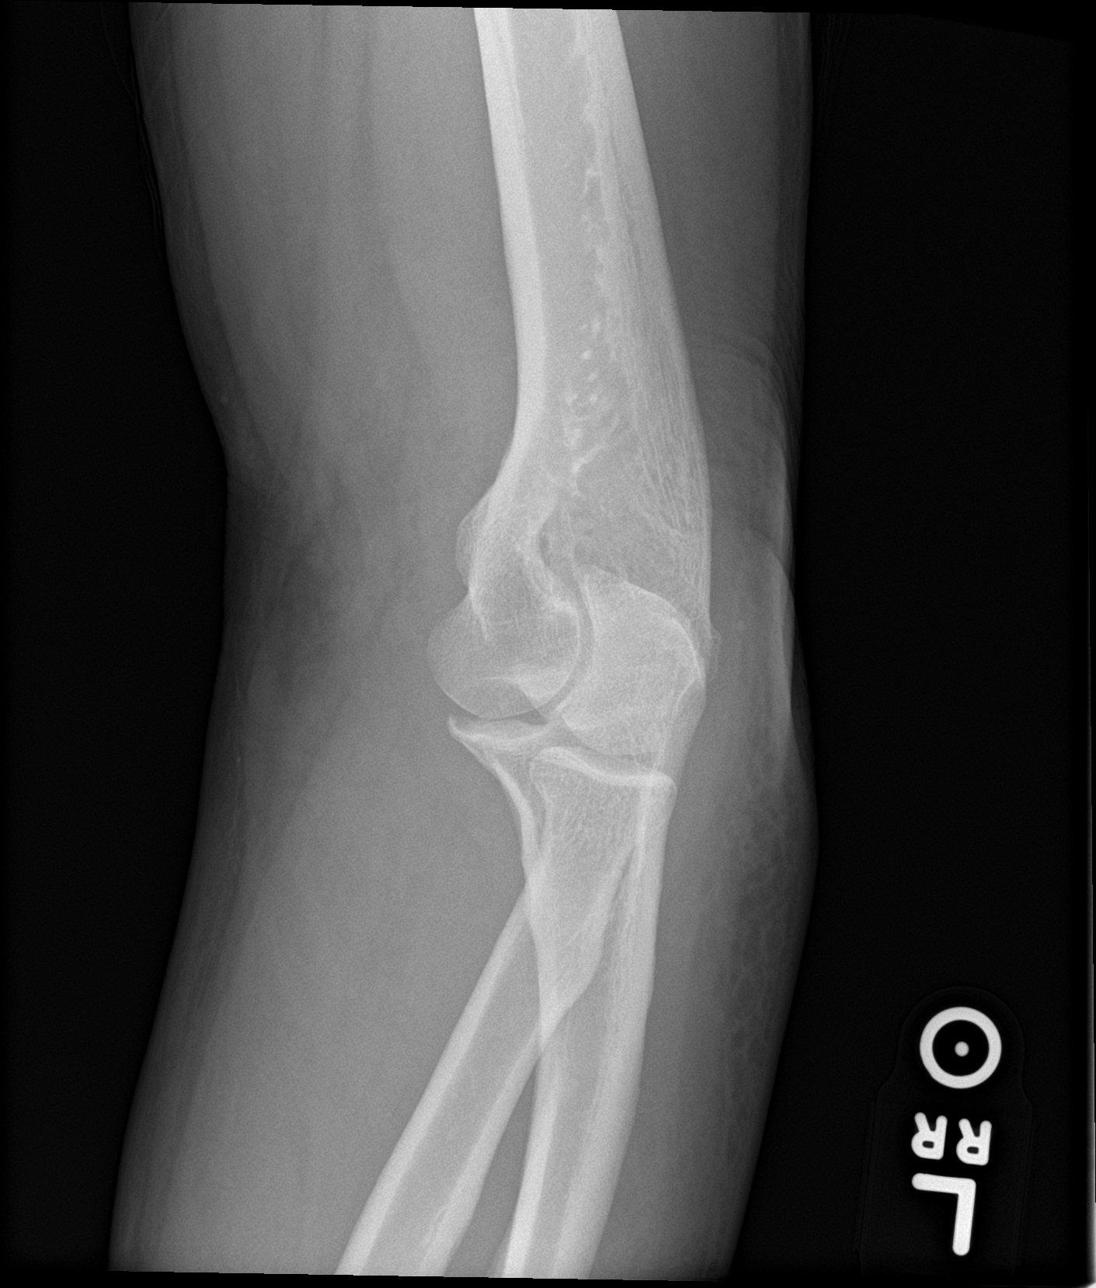

[elbow obl (2 of 2)]
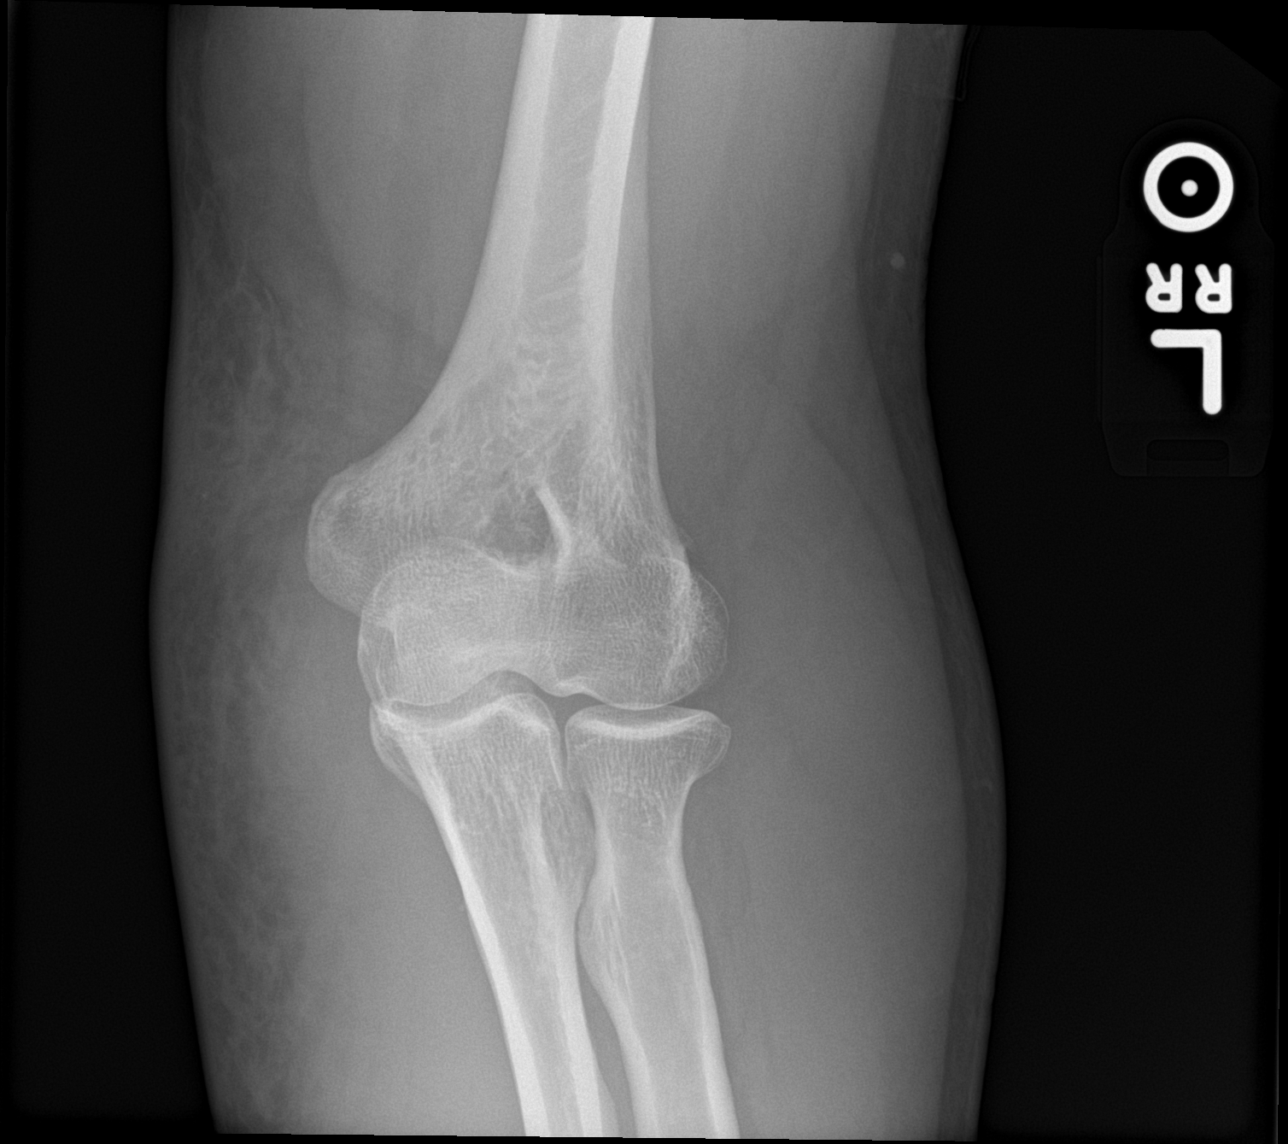

[elbow lat]
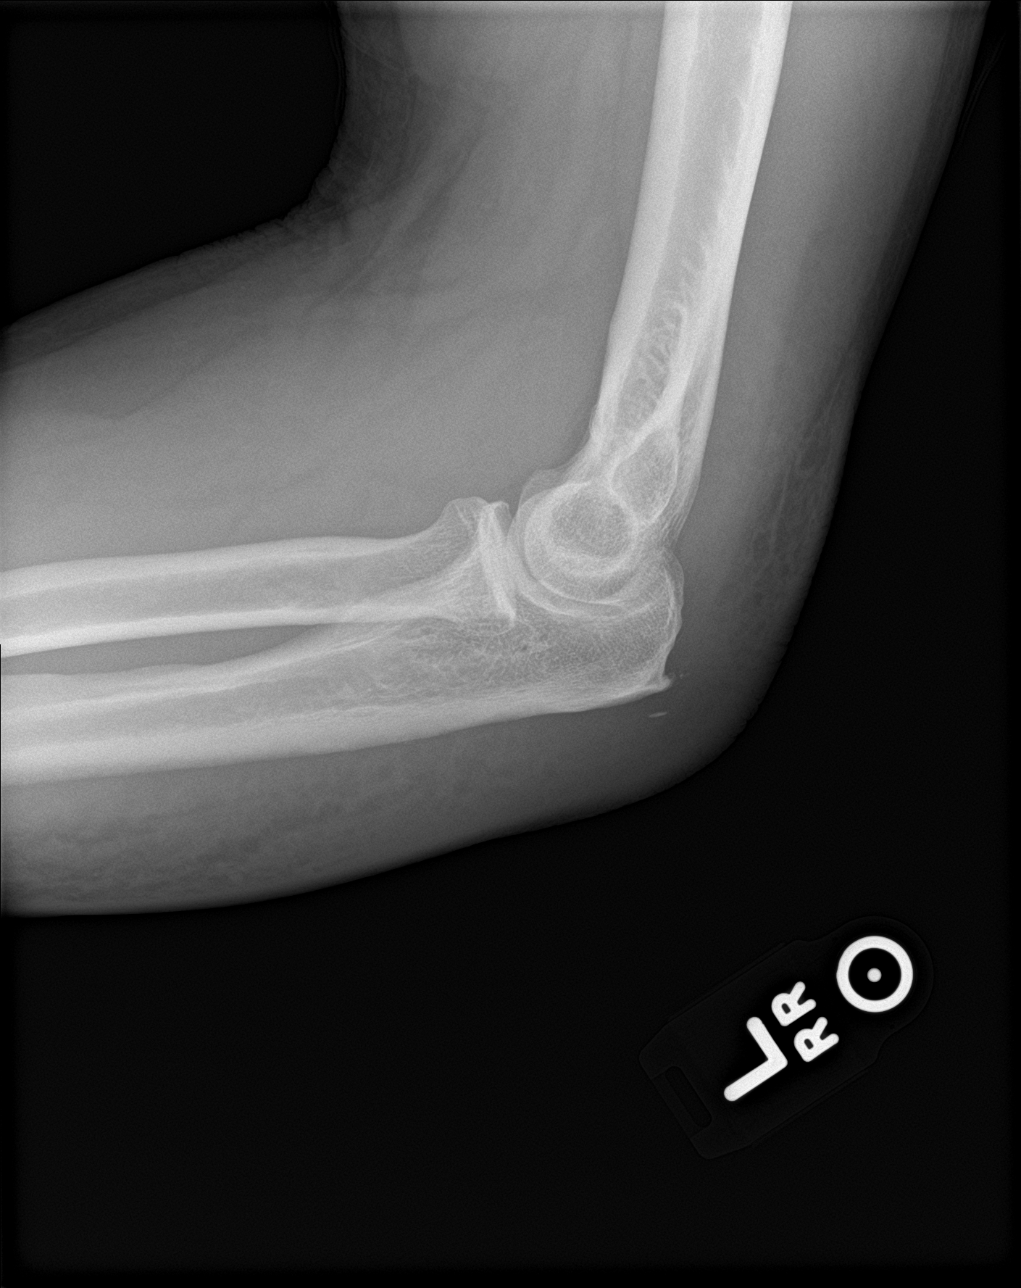

[4 of 4 positions shown; findings below may reference images not displayed]

FINDINGS: There is no evidence of fracture, dislocation, or joint effusion.
There is no evidence of arthropathy or other focal bone abnormality.
Soft tissues are unremarkable.
IMPRESSION: Negative.
